# Patient Record
Sex: Male | Born: 1960 | Race: Black or African American | Hispanic: No | Marital: Married
Health system: Southern US, Community
[De-identification: ages and names within clinical notes are randomized; demographics above are authoritative.]

---

## 2014-06-26 ENCOUNTER — Inpatient Hospital Stay: Payer: Self-pay | Admitting: Specialist

## 2014-06-26 LAB — BASIC METABOLIC PANEL
Anion Gap: 6 — ABNORMAL LOW (ref 7–16)
BUN: 22 mg/dL — AB (ref 7–18)
CALCIUM: 8.1 mg/dL — AB (ref 8.5–10.1)
Chloride: 111 mmol/L — ABNORMAL HIGH (ref 98–107)
Co2: 26 mmol/L (ref 21–32)
Creatinine: 0.88 mg/dL (ref 0.60–1.30)
EGFR (Non-African Amer.): >60
Glucose: 100 mg/dL — ABNORMAL HIGH (ref 65–99)
Osmolality: 288 (ref 275–301)
Potassium: 3.6 mmol/L (ref 3.5–5.1)
Sodium: 143 mmol/L (ref 136–145)

## 2014-06-26 LAB — CBC
HCT: 40 % (ref 40.0–52.0)
HGB: 13.4 g/dL (ref 13.0–18.0)
MCH: 33.3 pg (ref 26.0–34.0)
MCHC: 33.6 g/dL (ref 32.0–36.0)
MCV: 99 fL (ref 80–100)
Platelet: 148 10*3/uL — ABNORMAL LOW (ref 150–440)
RBC: 4.03 10*6/uL — ABNORMAL LOW (ref 4.40–5.90)
RDW: 14.1 % (ref 11.5–14.5)
WBC: 3.9 10*3/uL (ref 3.8–10.6)

## 2014-06-26 LAB — HEMOGLOBIN: HGB: 13.5 g/dL (ref 13.0–18.0)

## 2014-06-27 LAB — HEMATOCRIT: HCT: 37.7 % — ABNORMAL LOW (ref 40.0–52.0)

## 2015-02-07 NOTE — H&P (Signed)
Subjective/Chief Complaint Left ankle pain   History of Present Illness 54 year old male injured left ankle at work last night when a heavy roller fell on the left ankle.  He had immediate pain and deformity and was brought to Emergency Room where exam and X-rays show a completely displaced trimalleolar fracture dislocation of the ankle.  Dr Dolores Frame performed satisfactory closed reduction and splinting in Emergency Room and he was admitted for probable surgery.  Discussed surgery versus cast treatment and he agrees to surgery.  Risks and benefits of surgery were discussed at length including but not limited to infection, non union, nerve or blood vessed damage, non union, need for repeat surgery, blood clots and lung emboli, and death. Plan surgery this AM.   Past History Unremarkable.  Denies illness   Code Status Full Code   Past Med/Surgical Hx:  denies medical history:   denies surgical history:   ALLERGIES:  No Known Allergies:   Family and Social History:  Family History Non-Contributory   Place of Living Home   Review of Systems:  Fever/Chills No   Cough No   Sputum No   Abdominal Pain No   Physical Exam:  GEN well developed, well nourished   HEENT pink conjunctivae   NECK supple   RESP normal resp effort   CARD regular rate   ABD denies tenderness   LYMPH negative neck   EXTR negative edema, Left ankle with tenderness and swelling.  circulation/sensation/motor function good.  abrasion medially. very unstable   NEURO motor/sensory function intact   PSYCH alert, A+O to time, place, person   Lab Results: Routine Chem:  10-Sep-15 02:00   Glucose, Serum  100  BUN  22  Creatinine (comp) 0.88  Sodium, Serum 143  Potassium, Serum 3.6  Chloride, Serum  111  CO2, Serum 26  Calcium (Total), Serum  8.1  Anion Gap  6  Osmolality (calc) 288  eGFR (African American) >60  eGFR (Non-African American) >60 (eGFR values <54mL/min/1.73 m2 may be an indication of  chronic kidney disease (CKD). Calculated eGFR is useful in patients with stable renal function. The eGFR calculation will not be reliable in acutely ill patients when serum creatinine is changing rapidly. It is not useful in  patients on dialysis. The eGFR calculation may not be applicable to patients at the low and high extremes of body sizes, pregnant women, and vegetarians.)  Routine Hem:  10-Sep-15 02:00   WBC (CBC) 3.9  RBC (CBC)  4.03  Hemoglobin (CBC) 13.4  Hematocrit (CBC) 40.0  Platelet Count (CBC)  148 (Result(s) reported on 26 Jun 2014 at 03:04AM.)  MCV 99  MCH 33.3  MCHC 33.6  RDW 14.1   Radiology Results: XRay:    10-Sep-15 01:08, Ankle Left Complete  Ankle Left Complete  REASON FOR EXAM:    injury with deformity  COMMENTS:       PROCEDURE: DXR - DXR ANKLE LEFT COMPLETE  - Jun 26 2014  1:08AM     CLINICAL DATA:  Pain and deformity after work injury.    EXAM:  LEFT ANKLE COMPLETE - 3+ VIEW    COMPARISON:  None.    FINDINGS:  Trimalleolar fractures of the left ankle. Oblique fracture of the  distal left fibular shaft with posterior superior overriding and  lateral angulation of distal fracture fragment. Transverse fracture  of the medial malleolus with lateral displacement of the medial  malleolar fragment and of the talus with respect to the tibia.  Coronal posterior malleolar fracture with posterior displacement of  the talus with respect to the tibia. Soft tissue swelling.     IMPRESSION:  From malleolar fractures of the left ankle with lateral and  posterior displacement of fracture fragments as well as talus with  respect to the tibia.      Electronically Signed    By: Lucienne Capers M.D.    On: 06/26/2014 01:35     Verified By: Neale Burly, M.D.,    10-Sep-15 01:56, Ankle Left AP and Lateral  Ankle Left AP and Lateral  REASON FOR EXAM:    POST-REDUCTION  COMMENTS:   Bedside (portable):Y    PROCEDURE: DXR - DXR ANKLE LEFT AP AND  LATERAL  - Jun 26 2014  1:56AM     CLINICAL DATA:  Postreduction films.    EXAM:  LEFT ANKLE - 2 VIEW    COMPARISON:  06/26/2014    FINDINGS:  Interval reduction and casting of tri malleolar fractures of the  left ankle. Significant improvement of positioning of fracture  fragments and of the ankle with near-anatomic alignment noted.     IMPRESSION:  Significant improvement of alignment of trimalleolar fractures of  the left ankle postreduction.      Electronically Signed    By: Lucienne Capers M.D.    On: 06/26/2014 02:07         Verified By: Neale Burly, M.D.,  [place]:    10-Sep-15 01:08, Ankle Left Complete  PACS Image    10-Sep-15 01:56, Ankle Left AP and Lateral  PACS Image    Assessment/Admission Diagnosis Left ankle trimalleolar fracture dislocation   Plan open reduction and internal fixation left ankle   Electronic Signatures: Park Breed (MD)  (Signed 10-Sep-15 09:16)  Authored: CHIEF COMPLAINT and HISTORY, PAST MEDICAL/SURGIAL HISTORY, ALLERGIES, FAMILY AND SOCIAL HISTORY, REVIEW OF SYSTEMS, PHYSICAL EXAM, LABS, Radiology, ASSESSMENT AND PLAN   Last Updated: 10-Sep-15 09:16 by Park Breed (MD)

## 2015-02-07 NOTE — Op Note (Signed)
PATIENT NAME:  Keith Ray, Keith Ray MR#:  [id number] DATE OF BIRTH:  1961-02-10  DATE OF PROCEDURE:  06/26/2014  PREOPERATIVE DIAGNOSIS: Comminuted trimalleolar fracture-dislocation, left ankle.   POSTOPERATIVE DIAGNOSIS: Comminuted trimalleolar fracture-dislocation, left ankle.   PROCEDURE PERFORMED: Open reduction internal fixation of the left ankle (mediolateral and posterior malleolar).   SURGEON: Valinda HoarHoward E. Faraaz Wolin, M.D.   ANESTHESIA: Spinal.   COMPLICATIONS: None.   DRAINS: None.   ESTIMATED BLOOD LOSS: Minimal.    REPLACED: None.   OPERATIVE PROCEDURE: The patient was brought to the operating room where he underwent satisfactory spinal anesthesia and was placed in the supine position and padded appropriately on the operating room table. The left leg was prepped and draped in a sterile fashion. He had some dry eschar over the anteromedial aspect of the ankle and some small old scabs, laterally.  He had minimal swelling. A straight incision was made over the fibula and dissection carried out sharply down to periosteum, which was elevated. The fracture was irrigated and re-displaced, allowing removal of clot and small fragments from the lateral fracture site. The lateral fracture was then reduced and held with reduction forceps in an anatomic position. A 20 mm compression lag screw was placed from anterior to posterior across the fracture site to stabilize it. A 7-hole 3.5 mm locking plate was then contoured to fit the fibula and was fixed with 3 cortical screws above the fracture, and 2 locking screws and 1 cortical screw below the fracture. Fluoroscopy showed excellent position of the fracture and hardware, and the mortise was well reduced. The wound was irrigated and closed with 2-0 Vicryl and staples; 0.5% Marcaine was placed in the wound.   A longitudinal incision was then made medially over the medial malleolus avoiding the bruised skin. The dissection was carried out sharply down to  periosteum avoiding the saphenous nerve. The fracture site was opened and irrigated and debrided. The periosteum was pulled back to allow visualization of the bony fragments, and the bone was reapproximated anatomically and held with a Sweetheart clamp. Two guide pins were inserted and were seen to be in good position. Two 50 mm 4.0 cannulated screws were then inserted and fixed in the medial side securely. Fluoroscopy again showed good position of the hardware and the ankle joint mortise.   A stab wound was made anterolaterally just above the joint line and a guidepin inserted from front to back to fix the posterior fragment. This was then drilled and a 50 mm cannulated screw was inserted to catch and fix the posterior malleolus. Fracture lines were all anatomic. The hardware was in good position.   The  wounds were irrigated and closed with 0 Vicryl and staples; 0.5% Marcaine was placed in the remaining wounds. A dressing with a posterior splint was applied. The tourniquet was deflated with good return of blood flow to the foot. The patient was transferred to his hospital bed and taken to recovery in good condition.    ____________________________ Valinda HoarHoward E. Rebeca Valdivia, MD hem:JT D: 06/26/2014 12:04:57 ET T: 06/26/2014 12:31:29 ET JOB#: [id number]  cc: Valinda HoarHoward E. Roshell Brigham, MD, <Dictator> Valinda HoarHOWARD E Ryllie Nieland MD ELECTRONICALLY SIGNED 06/27/2014 13:34

## 2015-05-15 IMAGING — CR DG CHEST 1V PORT
1 series · 1 of 1 positions shown · non-contrast
Comparison: None.

CLINICAL DATA: Preop.  Postreduction films.

EXAM:
PORTABLE CHEST - 1 VIEW

[ap]
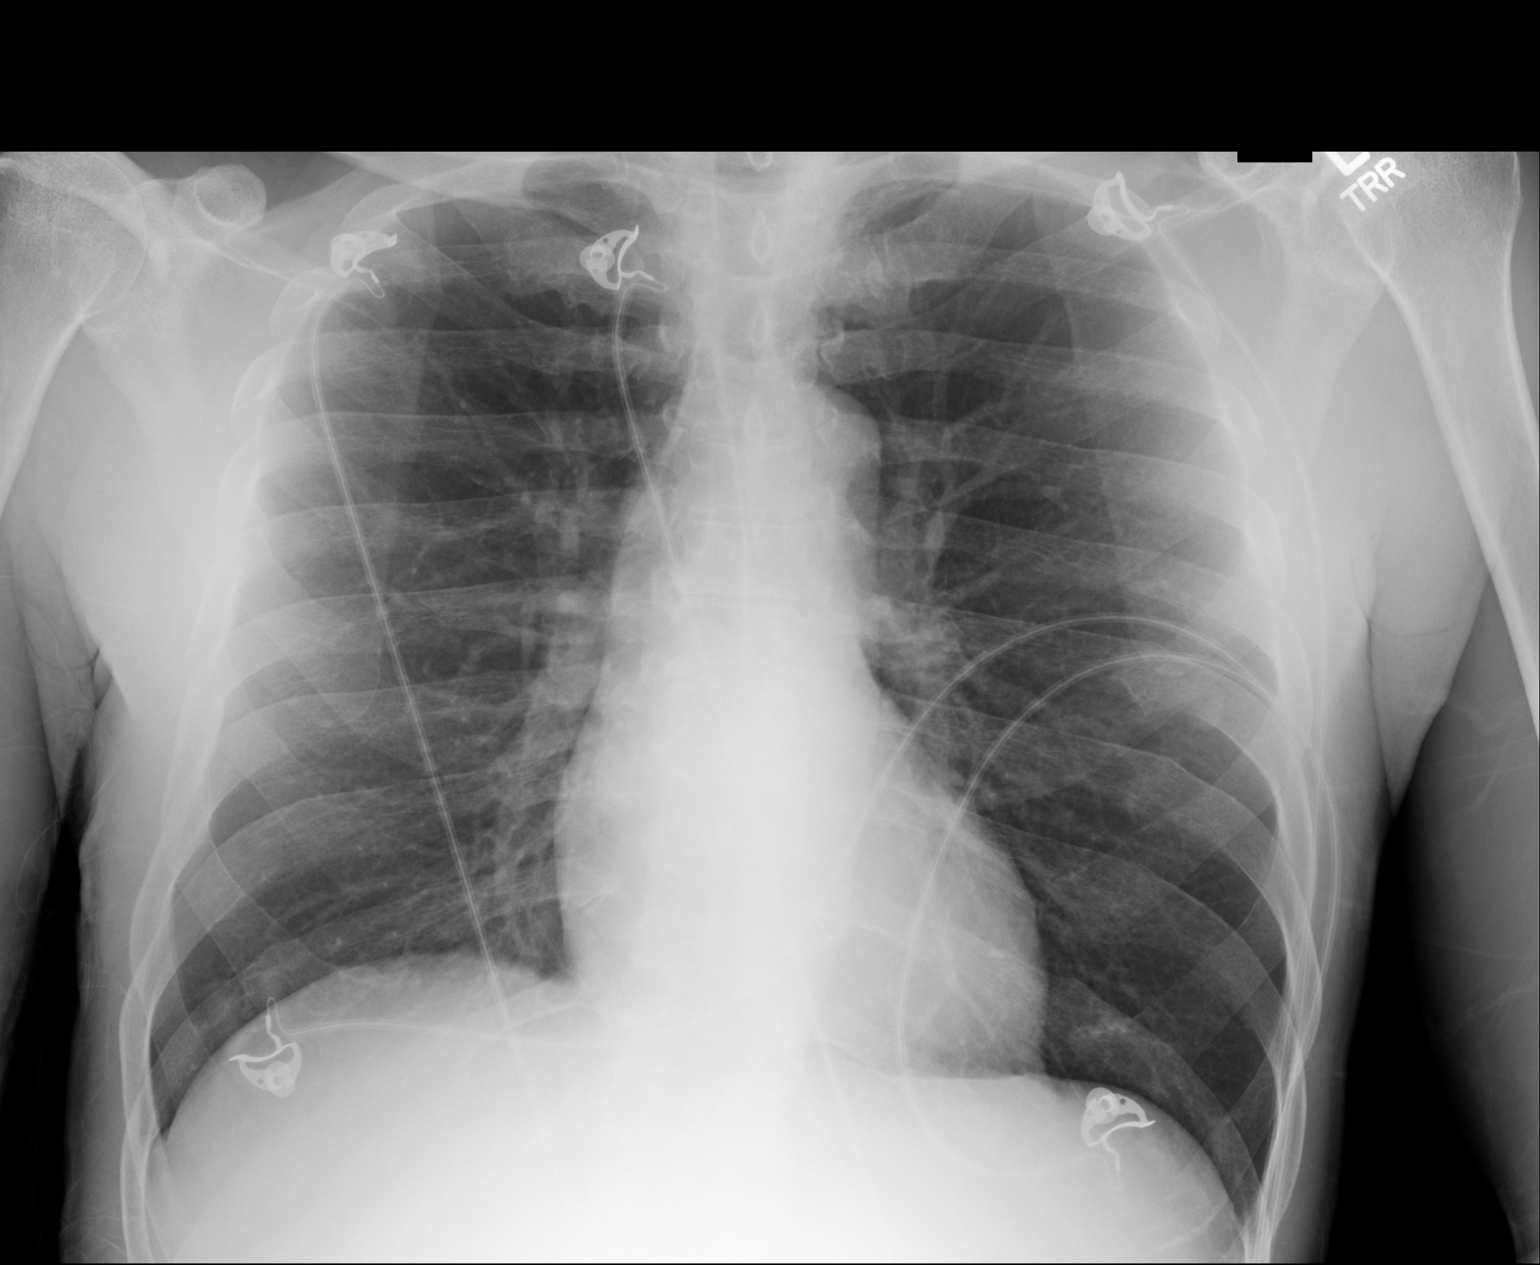

[1 of 1 positions shown; findings below may reference images not displayed]

FINDINGS: The heart size and mediastinal contours are within normal limits.
Both lungs are clear. Old left rib fractures.
IMPRESSION: No active disease.
